# Patient Record
Sex: Male | Born: 1980 | Race: White | Hispanic: No | Marital: Married | Smoking: Never smoker
Health system: Southern US, Community
[De-identification: ages and names within clinical notes are randomized; demographics above are authoritative.]

## PROBLEM LIST (undated history)

## (undated) HISTORY — PX: WRIST SURGERY: SHX841

## (undated) HISTORY — PX: SHOULDER SURGERY: SHX246

## (undated) HISTORY — PX: OTHER SURGICAL HISTORY: SHX169

---

## 2003-11-20 ENCOUNTER — Ambulatory Visit (HOSPITAL_COMMUNITY): Admission: RE | Admit: 2003-11-20 | Discharge: 2003-11-20 | Payer: Self-pay | Admitting: Hematology & Oncology

## 2004-12-11 ENCOUNTER — Ambulatory Visit: Payer: Self-pay | Admitting: Hematology & Oncology

## 2015-04-04 ENCOUNTER — Ambulatory Visit (INDEPENDENT_AMBULATORY_CARE_PROVIDER_SITE_OTHER): Payer: Commercial Managed Care - PPO | Admitting: Podiatry

## 2015-04-04 ENCOUNTER — Encounter: Payer: Self-pay | Admitting: Podiatry

## 2015-04-04 VITALS — BP 130/85 | HR 61 | Resp 16

## 2015-04-04 DIAGNOSIS — L03032 Cellulitis of left toe: Secondary | ICD-10-CM

## 2015-04-04 DIAGNOSIS — L600 Ingrowing nail: Secondary | ICD-10-CM

## 2015-04-04 NOTE — Progress Notes (Signed)
Subjective:     Patient ID: Tony Dixon, male   DOB: 1980-12-21, 34 y.o.   MRN: [id number]  HPIThis patient presents to office with infected ingrown toenail medial border left big toe.  He says this has been painful and infected for months.  He presents for evaluation and treatment.   Review of Systems     Objective:   Physical Exam Objective: Review of past medical history, medications, social history and allergies were performed.  Vascular: Dorsalis pedis and posterior tibial pulses were palpable B/L, capillary refill was  WNL B/L, temperature gradient was WNL B/L   Skin:  No signs of symptoms of infection or ulcers on both feet  Nails: pain redness and swelling along medial border left great toe.  Sensory: Phoebe PerchSemmes Weinstein monifilament WNL   Orthopedic: Orthopedic evaluation demonstrates all joints distal t ankle have full ROM without crepitus, muscle power WNL B/L     Assessment:     Paronychia medial border left great toe.     Plan:     ROV  I & D medial border left great toe under local anesthesia.  Neosporin/DSD.  Told to take his antibiotics which he already has at home.  Home instructions given.

## 2015-11-29 ENCOUNTER — Encounter: Payer: Self-pay | Admitting: Podiatry

## 2015-11-29 ENCOUNTER — Ambulatory Visit (INDEPENDENT_AMBULATORY_CARE_PROVIDER_SITE_OTHER): Payer: Commercial Managed Care - PPO | Admitting: Podiatry

## 2015-11-29 VITALS — BP 139/90 | HR 69 | Resp 12

## 2015-11-29 DIAGNOSIS — L03031 Cellulitis of right toe: Secondary | ICD-10-CM | POA: Diagnosis not present

## 2015-11-29 DIAGNOSIS — L600 Ingrowing nail: Secondary | ICD-10-CM

## 2015-11-29 DIAGNOSIS — L03011 Cellulitis of right finger: Secondary | ICD-10-CM

## 2015-11-29 MED ORDER — CEPHALEXIN 500 MG PO CAPS: 500.0000 mg | ORAL_CAPSULE | Freq: Two times a day (BID) | ORAL | Status: AC

## 2015-11-29 MED ORDER — DOXYCYCLINE HYCLATE 100 MG PO TABS: 100.0000 mg | ORAL_TABLET | Freq: Two times a day (BID) | ORAL | Status: DC

## 2015-11-29 NOTE — Progress Notes (Signed)
Subjective:     Patient ID: Tony FewDavid R Rieser, male   DOB: Feb 13, 1981, 35 y.o.   MRN: [id number]  HPI this patient presents the office with chief complaint of a painful right  big toe. He states that the pain has been present for months. He has attempted Epsom salts and peroxide but the problem persists. He says he was treated by Dr. Elijah Birkom who performed nail surgery last year but the problem has returned.   the right  great toe is painful walking and wearing his shoes. He presents the office for an evaluation and treatment. This patient was treated myself for nail problem to his left foot which has healed uneventfully.   Review of Systems     Objective:   Physical Exam GENERAL APPEARANCE: Alert, conversant. Appropriately groomed. No acute distress.  VASCULAR: Pedal pulses palpable at  [facility] and PT bilateral.  Capillary refill time is immediate to all digits,  Normal temperature gradient.  Digital hair growth is present bilateral  NEUROLOGIC: sensation is normal to 5.07 monofilament at 5/5 sites bilateral.  Light touch is intact bilateral, Muscle strength normal.  MUSCULOSKELETAL: acceptable muscle strength, tone and stability bilateral.  Intrinsic muscluature intact bilateral.  Rectus appearance of foot and digits noted bilateral.   DERMATOLOGIC: skin color, texture, and turgor are within normal limits.  No preulcerative lesions or ulcers  are seen, no interdigital maceration noted.  No open lesions present.  NAILS Pain redness and swelling with pain outside border right big toe.  Marked incurvation medial border right great toe.         Assessment:     Ingrown toenail right foot   Paronychia right hallux.     Plan:     ROV  Nail surgery right foot.  Treatment options and alternatives discussed.  Recommended permanent phenol matrixectomy and patient agreed.  Right hallux  was prepped with alcohol and a toe block of 3cc of 2% lidocaine plain was administered in a digital toe block. .  The toe was  then prepped with betadine solution .  The medial and lateral  nail borders  were then excised and matrix tissue exposed.  Phenol was then applied to the matrix tissue followed by an alcohol wash.  Antibiotic ointment and a dry sterile dressing was applied.  The patient was dispensed instructions for aftercare.Prescribe cephalexin 500 mg  # 15.  RTC 2 weeks.   Helane GuntherGregory Gelsey Amyx DPM      Helane GuntherGregory Azavier Creson DPM

## 2015-12-10 DIAGNOSIS — Z792 Long term (current) use of antibiotics: Secondary | ICD-10-CM | POA: Diagnosis not present

## 2015-12-10 DIAGNOSIS — K8020 Calculus of gallbladder without cholecystitis without obstruction: Secondary | ICD-10-CM | POA: Diagnosis not present

## 2015-12-10 DIAGNOSIS — R1010 Upper abdominal pain, unspecified: Secondary | ICD-10-CM | POA: Diagnosis present

## 2015-12-10 NOTE — ED Notes (Addendum)
Pt to triage via w/c; st onset ago, sudden onset upper abd pain, nonradiating; denies hx of same; pt pale, appears uncomfortable, grimacing

## 2015-12-11 ENCOUNTER — Emergency Department
Admission: EM | Admit: 2015-12-11 | Discharge: 2015-12-11 | Disposition: A | Discharge status: Home / Self Care | Payer: Commercial Managed Care - PPO | Attending: Emergency Medicine | Admitting: Emergency Medicine

## 2015-12-11 ENCOUNTER — Emergency Department: Payer: Commercial Managed Care - PPO

## 2015-12-11 ENCOUNTER — Encounter: Payer: Self-pay | Admitting: Emergency Medicine

## 2015-12-11 DIAGNOSIS — R109 Unspecified abdominal pain: Secondary | ICD-10-CM

## 2015-12-11 DIAGNOSIS — K8020 Calculus of gallbladder without cholecystitis without obstruction: Secondary | ICD-10-CM

## 2015-12-11 LAB — CBC
HCT: 38.4 % — ABNORMAL LOW (ref 40.0–52.0)
Hemoglobin: 14 g/dL (ref 13.0–18.0)
MCH: 30.1 pg (ref 26.0–34.0)
MCHC: 36.5 g/dL — ABNORMAL HIGH (ref 32.0–36.0)
MCV: 82.5 fL (ref 80.0–100.0)
Platelets: 229 K/uL (ref 150–440)
RBC: 4.66 MIL/uL (ref 4.40–5.90)
RDW: 20.4 % — ABNORMAL HIGH (ref 11.5–14.5)
WBC: 9.5 K/uL (ref 3.8–10.6)

## 2015-12-11 LAB — TROPONIN I: Troponin I: <0.03 ng/mL

## 2015-12-11 LAB — COMPREHENSIVE METABOLIC PANEL
ALBUMIN: 5 g/dL (ref 3.5–5.0)
ALT: 47 U/L (ref 17–63)
AST: 63 U/L — AB (ref 15–41)
Alkaline Phosphatase: 50 U/L (ref 38–126)
Anion gap: 5 (ref 5–15)
BUN: 17 mg/dL (ref 6–20)
CHLORIDE: 104 mmol/L (ref 101–111)
CO2: 29 mmol/L (ref 22–32)
CREATININE: 1.15 mg/dL (ref 0.61–1.24)
Calcium: 9.6 mg/dL (ref 8.9–10.3)
GFR calc non Af Amer: >60 mL/min (ref >60)
GLUCOSE: 107 mg/dL — AB (ref 65–99)
Potassium: 3.1 mmol/L — ABNORMAL LOW (ref 3.5–5.1)
SODIUM: 138 mmol/L (ref 135–145)
Total Bilirubin: 2.3 mg/dL — ABNORMAL HIGH (ref 0.3–1.2)
Total Protein: 7.6 g/dL (ref 6.5–8.1)

## 2015-12-11 LAB — LIPASE, BLOOD: Lipase: 19 U/L (ref 11–51)

## 2015-12-11 MED ORDER — MORPHINE SULFATE (PF) 4 MG/ML IV SOLN
4.0000 mg | Freq: Once | INTRAVENOUS | Status: AC
  Administered 2015-12-11: 4 mg via INTRAVENOUS
  Filled 2015-12-11: qty 1

## 2015-12-11 MED ORDER — SODIUM CHLORIDE 0.9 % IV BOLUS (SEPSIS)
1000.0000 mL | Freq: Once | INTRAVENOUS | Status: AC
  Administered 2015-12-11: 1000 mL via INTRAVENOUS

## 2015-12-11 MED ORDER — OXYCODONE-ACETAMINOPHEN 5-325 MG PO TABS: 1.0000 | ORAL_TABLET | Freq: Four times a day (QID) | ORAL | Status: AC | PRN

## 2015-12-11 MED ORDER — GI COCKTAIL ~~LOC~~
30.0000 mL | Freq: Once | ORAL | Status: AC
  Administered 2015-12-11: 30 mL via ORAL
  Filled 2015-12-11: qty 30

## 2015-12-11 MED ORDER — ONDANSETRON 4 MG PO TBDP: 4.0000 mg | ORAL_TABLET | Freq: Three times a day (TID) | ORAL | Status: AC | PRN

## 2015-12-11 MED ORDER — ONDANSETRON HCL 4 MG/2ML IJ SOLN
4.0000 mg | Freq: Once | INTRAMUSCULAR | Status: AC
  Administered 2015-12-11: 4 mg via INTRAVENOUS
  Filled 2015-12-11: qty 2

## 2015-12-11 NOTE — ED Notes (Signed)
Patient transported to Ultrasound via stretcher 

## 2015-12-11 NOTE — ED Provider Notes (Signed)
[facility] Emergency Department Provider Note  ____________________________________________  Time seen: Approximately 0030 AM  I have reviewed the triage vital signs and the nursing notes.   HISTORY  Chief Complaint Abdominal Pain    HPI Tony Dixon is a 35 y.o. male who comes into the hospital today with some upper abdominal pain. The patient reports that the pain started about 1-1/2 hours before he arrived to the hospital. He reports that the pain is currently 8-9 out of 10 in intensity but it was worse before. The patient has a history of hereditary spherocytosis and he reports that he used to have some abdominal pains when he was 18-19 but it has not been a problem since then. The patient has been nauseous with some burping but no vomiting. The patient reports that his nausea was due to pain. The patient ate some dinner which she reports was banana sandwiches and a chocolate muffin. He reports that he is on some doxycycline for an ingrown toenail. The patient reports that the pain initially started on the left feels as though it's move more to the right. The patient is never had pain this severe before so he was concerned and decided to come into the hospital it checked out.   History reviewed. No pertinent past medical history.  There are no active problems to display for this patient.   Past Surgical History  Procedure Laterality Date  . Wrist surgery    . Shoulder surgery    . Wisdom teeth removal      Current Outpatient Rx  Name  Route  Sig  Dispense  Refill  . cephALEXin (KEFLEX) 500 MG capsule   Oral   Take 1 capsule (500 mg total) by mouth 2 (two) times daily.   20 capsule   0   . ondansetron (ZOFRAN ODT) 4 MG disintegrating tablet   Oral   Take 1 tablet (4 mg total) by mouth every 8 (eight) hours as needed for nausea or vomiting.   20 tablet   0   . oxyCODONE-acetaminophen (ROXICET) 5-325 MG tablet   Oral   Take 1 tablet by mouth  every 6 (six) hours as needed.   12 tablet   0     Allergies Review of patient's allergies indicates no known allergies.  No family history on file.  Social History Social History  Substance Use Topics  . Smoking status: Never Smoker   . Smokeless tobacco: Never Used  . Alcohol Use: No    Review of Systems Constitutional: No fever/chills Eyes: No visual changes. ENT: No sore throat. Cardiovascular: Denies chest pain. Respiratory: Denies shortness of breath. Gastrointestinal:  abdominal pain and nausea, no vomiting.  No diarrhea.  No constipation. Genitourinary: Negative for dysuria. Musculoskeletal: Negative for back pain. Skin: Negative for rash. Neurological: Negative for headaches, focal weakness or numbness.  10-point ROS otherwise negative.  ____________________________________________   PHYSICAL EXAM:  VITAL SIGNS: ED Triage Vitals  Enc Vitals Group     BP 12/11/15 0001 153/111 mmHg     Pulse Rate 12/11/15 0001 74     Resp 12/11/15 0001 20     Temp --      Temp src --      SpO2 12/11/15 0001 100 %     Weight 12/11/15 0001 180 lb (81.647 kg)     Height 12/11/15 0001  (1.778 m)     Head Cir --      Peak Flow --  Pain Score 12/11/15 0001 9     Pain Loc --      Pain Edu? --      Excl. in GC? --     Constitutional: Alert and oriented. Well appearing and in moderate distress. Eyes: Conjunctivae are normal. PERRL. EOMI. Head: Atraumatic. Nose: No congestion/rhinnorhea. Mouth/Throat: Mucous membranes are moist.  Oropharynx non-erythematous. Cardiovascular: Normal rate, regular rhythm. Grossly normal heart sounds.  Good peripheral circulation. Respiratory: Normal respiratory effort.  No retractions. Lungs CTAB. Gastrointestinal: Soft with epigastric and right upper quadrant tenderness to palpation. No distention. Positive bowel sounds Musculoskeletal: No lower extremity tenderness nor edema.  Neurologic:  Normal speech and language.  Skin:   Skin is warm, dry and intact.  Psychiatric: Mood and affect are normal.   ____________________________________________   LABS (all labs ordered are listed, but only abnormal results are displayed)  Labs Reviewed  CBC - Abnormal; Notable for the following:    HCT 38.4 (*)    MCHC 36.5 (*)    RDW 20.4 (*)    All other components within normal limits  COMPREHENSIVE METABOLIC PANEL - Abnormal; Notable for the following:    Potassium 3.1 (*)    Glucose, Bld 107 (*)    AST 63 (*)    Total Bilirubin 2.3 (*)    All other components within normal limits  LIPASE, BLOOD  TROPONIN I  TROPONIN I   ____________________________________________  EKG  ED ECG REPORT I, Tony Dixon,  Tony Dixon, the attending physician, personally viewed and interpreted this ECG.   Date: 12/11/2015  EKG Time: 0005  Rate: 66  Rhythm: normal sinus rhythm  Axis: normal  Intervals:none  ST&T Change: none  ____________________________________________  RADIOLOGY  CXR: No cardiopulmonary disease  US abd limited: Cholelithiasis with stones and sludge in the gallbladder, no additional findings to suggest acute cholecystitis. ____________________________________________   PROCEDURES  Procedure(s) performed: None  Critical Care performed: No  ____________________________________________   INITIAL IMPRESSION / ASSESSMENT AND PLAN / ED COURSE  Pertinent labs & imaging results that were available during my care of the patient were reviewed by me and considered in my medical decision making (see chart for details).  This is a 35 year old male who comes into the hospital today with some abdominal pain. The patient did receive a dose of morphine 4 mg, Zofran and liter of normal saline. He reports this pain is much improved but not fully gone. He does appear to have some gallstones. I will give him a GI cocktail as well and then reassess his pain.  The patient is not in any pain at this time. I did repeat the  patient's troponin is negative. I did have a conversation about him receiving an elective cholecystectomy. I also informed him that should the pain get worse he doesn't return for further evaluation to ensure that he does not have a clogged duct or pancreatitis as well as cholecystitis. The patient understands. He'll be discharged home to follow-up with surgery. ____________________________________________   FINAL CLINICAL IMPRESSION(S) / ED DIAGNOSES  Final diagnoses:  Abdominal pain  Gall stones      Tony ApleyAllison Dixon Luree Palla, MD 12/11/15 [phone]

## 2015-12-11 NOTE — ED Notes (Signed)

## 2015-12-11 NOTE — Discharge Instructions (Signed)
Cholelithiasis °Cholelithiasis (also called gallstones) is a form of gallbladder disease in which gallstones form in your gallbladder. The gallbladder is an organ that stores bile made in the liver, which helps digest fats. Gallstones begin as small crystals and slowly grow into stones. Gallstone pain occurs when the gallbladder spasms and a gallstone is blocking the duct. Pain can also occur when a stone passes out of the duct.  °RISK FACTORS °· Being male.   °· Having multiple pregnancies. Health care providers sometimes advise removing diseased gallbladders before future pregnancies.   °· Being obese. °· Eating a diet heavy in fried foods and fat.   °· Being older than 60 years and increasing age.   °· Prolonged use of medicines containing male hormones.   °· Having diabetes mellitus.   °· Rapidly losing weight.   °· Having a family history of gallstones (heredity).   °SYMPTOMS °· Nausea.   °· Vomiting. °· Abdominal pain.   °· Yellowing of the skin (jaundice).   °· Sudden pain. It may persist from several minutes to several hours. °· Fever.   °· Tenderness to the touch.  °In some cases, when gallstones do not move into the bile duct, people have no pain or symptoms. These are called "silent" gallstones.  °TREATMENT °Silent gallstones do not need treatment. In severe cases, emergency surgery may be required. Options for treatment include: °· Surgery to remove the gallbladder. This is the most common treatment. °· Medicines. These do not always work and may take 6-12 months or more to work. °· Shock wave treatment (extracorporeal biliary lithotripsy). In this treatment an ultrasound machine sends shock waves to the gallbladder to break gallstones into smaller pieces that can pass into the intestines or be dissolved by medicine. °HOME CARE INSTRUCTIONS  °· Only take over-the-counter or prescription medicines for pain, discomfort, or fever as directed by your health care provider.   °· Follow a low-fat diet until  seen again by your health care provider. Fat causes the gallbladder to contract, which can result in pain.   °· Follow up with your health care provider as directed. Attacks are almost always recurrent and surgery is usually required for permanent treatment.   °SEEK IMMEDIATE MEDICAL CARE IF:  °· Your pain increases and is not controlled by medicines.   °· You have a fever or persistent symptoms for more than 2-3 days.   °· You have a fever and your symptoms suddenly get worse.   °· You have persistent nausea and vomiting.   °MAKE SURE YOU:  °· Understand these instructions. °· Will watch your condition. °· Will get help right away if you are not doing well or get worse. °  °This information is not intended to replace advice given to you by your health care provider. Make sure you discuss any questions you have with your health care provider. °  °Document Released: 09/04/2005 Document Revised: 05/11/2013 Document Reviewed: 03/02/2013 °Elsevier Interactive Patient Education ©2016 [employer] ° °Abdominal Pain, Adult °Many things can cause abdominal pain. Usually, abdominal pain is not caused by a disease and will improve without treatment. It can often be observed and treated at home. Your health care provider will do a physical exam and possibly order blood tests and X-rays to help determine the seriousness of your pain. However, in many cases, more time must pass before a clear cause of the pain can be found. Before that point, your health care provider may not know if you need more testing or further treatment. °HOME CARE INSTRUCTIONS °Monitor your abdominal pain for any changes. The following actions may help to   alleviate any discomfort you are experiencing: °· Only take over-the-counter or prescription medicines as directed by your health care provider. °· Do not take laxatives unless directed to do so by your health care provider. °· Try a clear liquid diet (broth, tea, or water) as directed by your health  care provider. Slowly move to a bland diet as tolerated. °SEEK MEDICAL CARE IF: °· You have unexplained abdominal pain. °· You have abdominal pain associated with nausea or diarrhea. °· You have pain when you urinate or have a bowel movement. °· You experience abdominal pain that wakes you in the night. °· You have abdominal pain that is worsened or improved by eating food. °· You have abdominal pain that is worsened with eating fatty foods. °· You have a fever. °SEEK IMMEDIATE MEDICAL CARE IF: °· Your pain does not go away within 2 hours. °· You keep throwing up (vomiting). °· Your pain is felt only in portions of the abdomen, such as the right side or the left lower portion of the abdomen. °· You pass bloody or black tarry stools. °MAKE SURE YOU: °· Understand these instructions. °· Will watch your condition. °· Will get help right away if you are not doing well or get worse. °  °This information is not intended to replace advice given to you by your health care provider. Make sure you discuss any questions you have with your health care provider. °  °Document Released: 06/18/2005 Document Revised: 05/30/2015 Document Reviewed: 05/18/2013 °Elsevier Interactive Patient Education ©2016 [employer] Inc[employer] ° °

## 2015-12-13 ENCOUNTER — Ambulatory Visit: Payer: Commercial Managed Care - PPO | Admitting: Podiatry

## 2016-04-24 IMAGING — US US ABDOMEN LIMITED
1 series · 14 of 25 positions shown · non-contrast
Comparison: 11/20/2003

CLINICAL DATA: Abdominal pain for several hours.

EXAM:
US ABDOMEN LIMITED - RIGHT UPPER QUADRANT

[Series 1: us abdomen limited · 0.19mm/px · 14 of 44 slices shown]
[im 1/44]
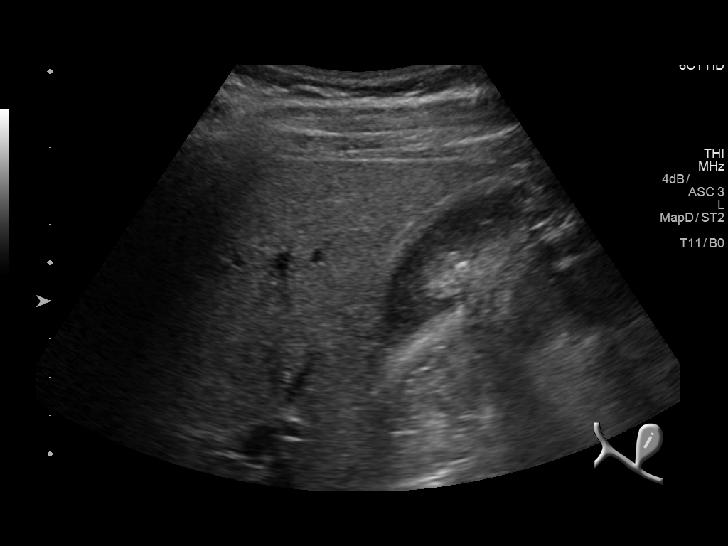
[im 4/44]
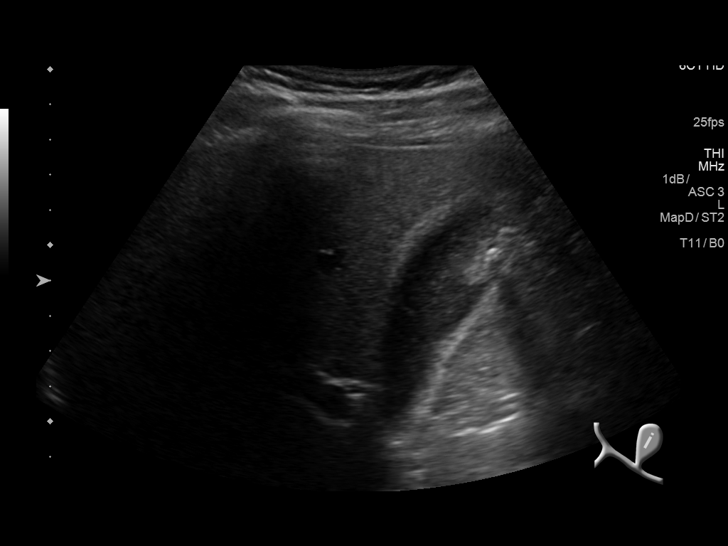
[im 8/44]
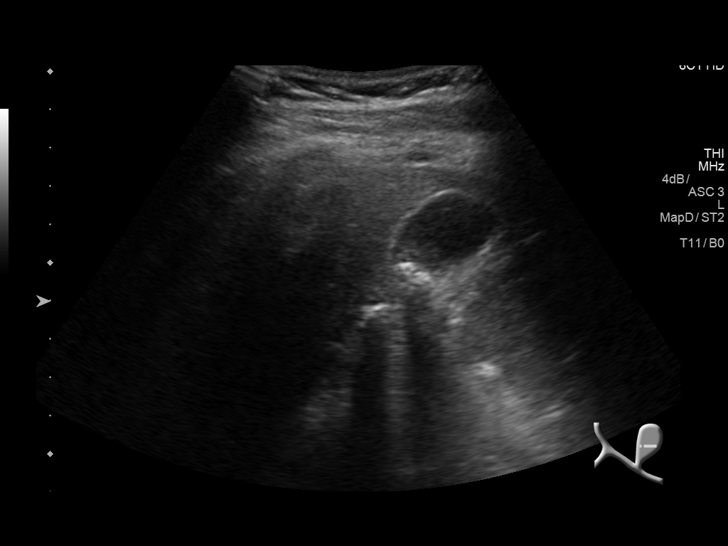
[im 11/44]
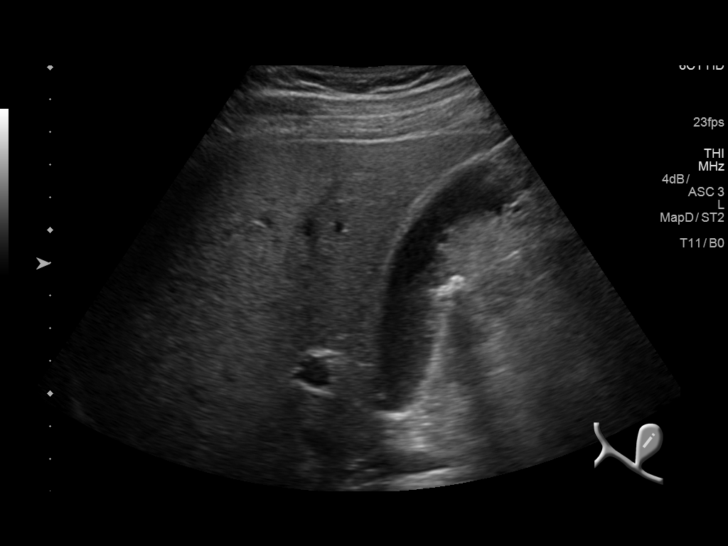
[im 15/44]
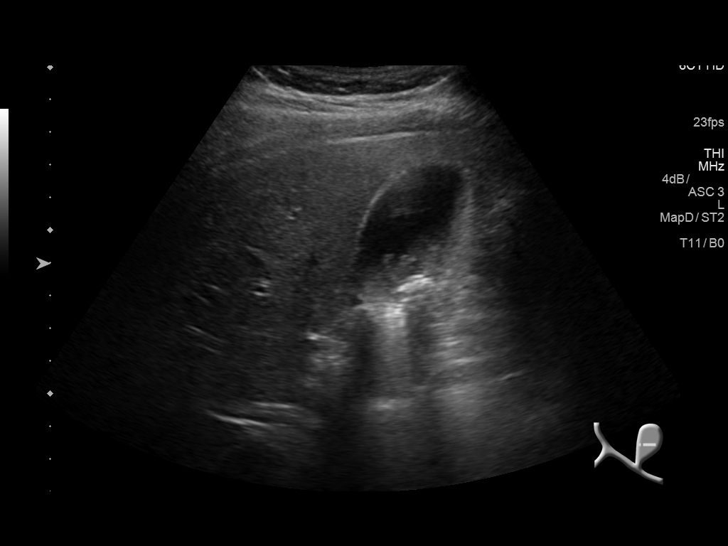
[im 17/44]
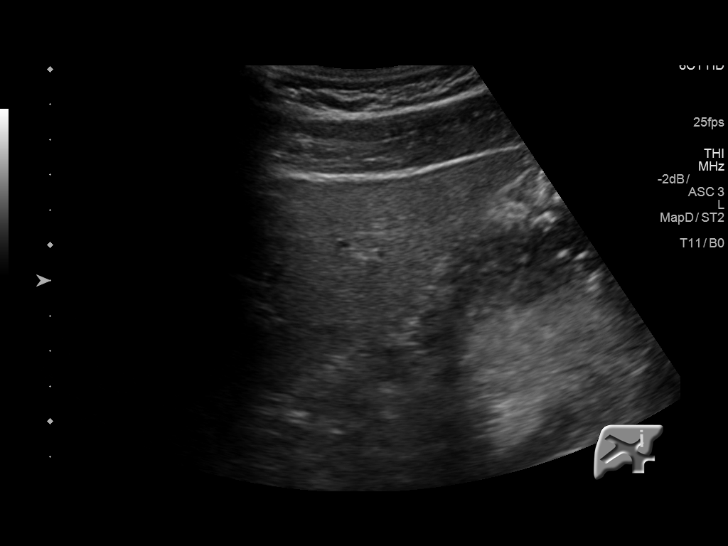
[im 20/44]
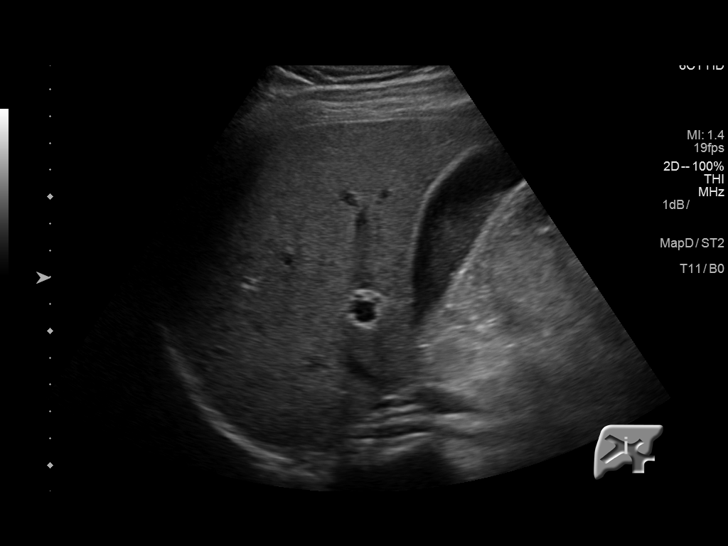
[im 24/44]
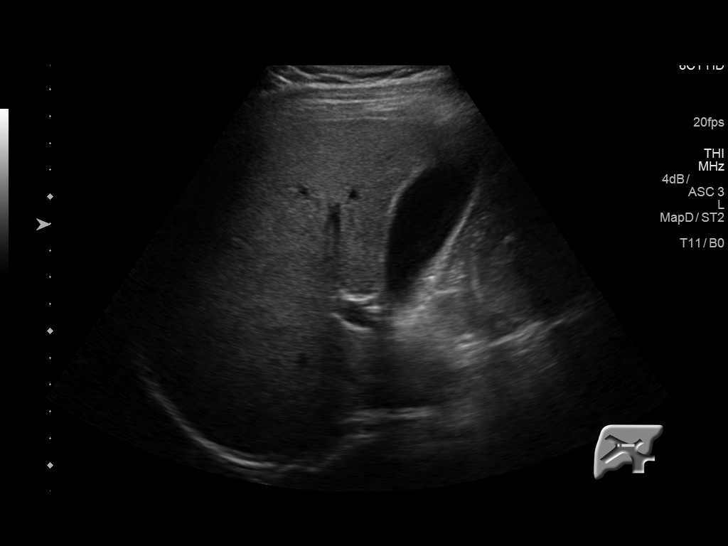
[im 27/44]
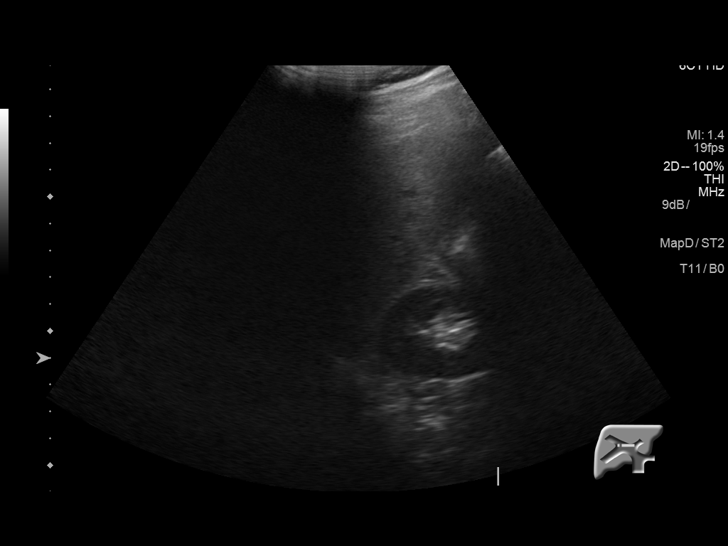
[im 29/44]
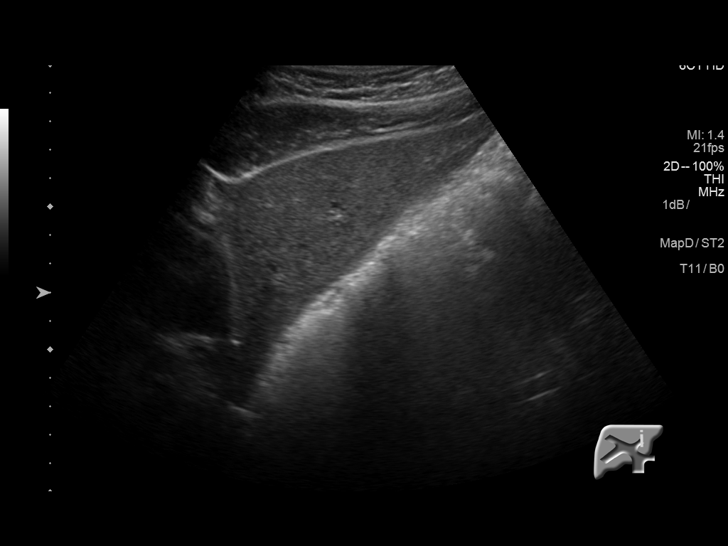
[im 33/44]
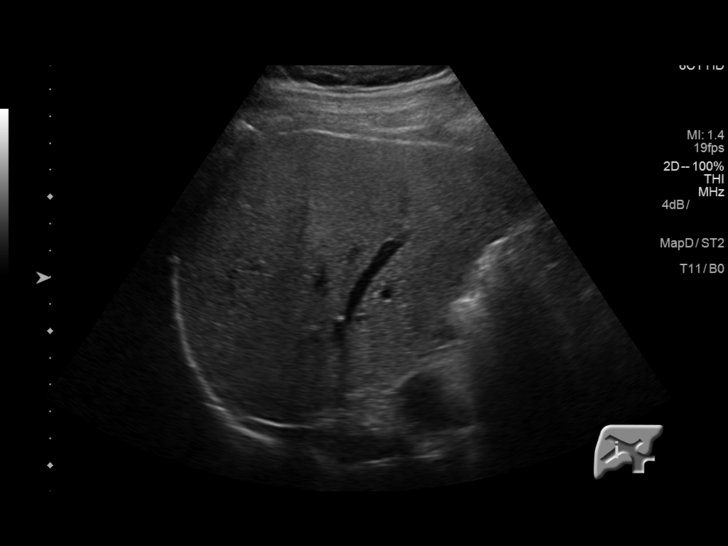
[im 36/44]
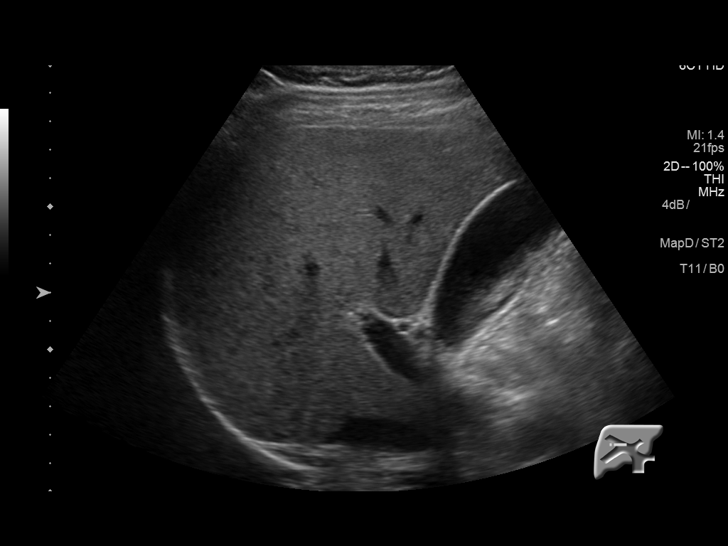
[im 40/44]
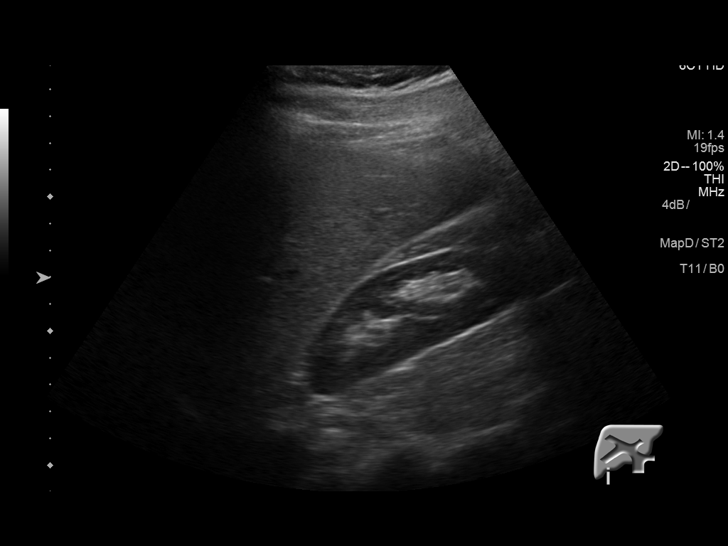
[im 44/44]
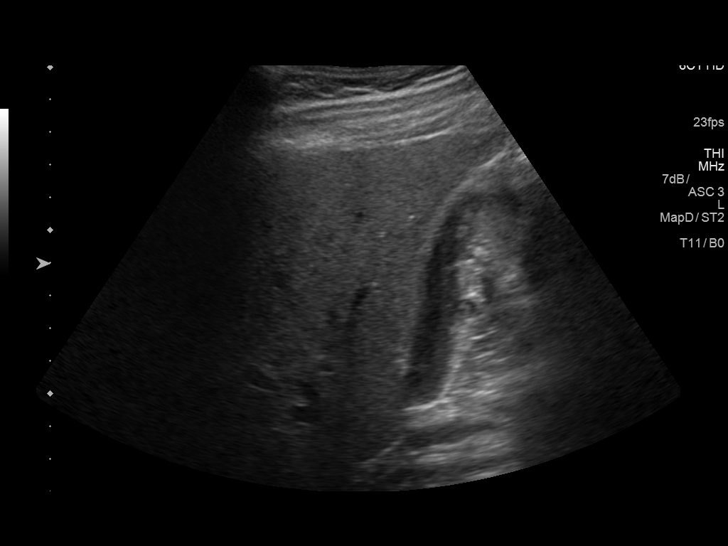

[14 of 25 positions shown; findings below may reference images not displayed]

FINDINGS: Gallbladder:

Multiple stones and sludge demonstrated throughout the dependent
portion of the gallbladder. Largest stone measures about 6 mm in
diameter. No gallbladder wall thickening or edema. Murphy's sign is
negative.

Common bile duct:

Diameter: 4.3 mm, normal

Liver:

No focal lesion identified. Within normal limits in parenchymal
echogenicity.
IMPRESSION: Cholelithiasis with stones and sludge in the gallbladder. No
additional findings to suggest acute cholecystitis.

## 2016-10-18 ENCOUNTER — Encounter (HOSPITAL_COMMUNITY): Payer: Self-pay | Admitting: *Deleted

## 2016-10-18 ENCOUNTER — Ambulatory Visit (HOSPITAL_COMMUNITY)
Admission: EM | Admit: 2016-10-18 | Discharge: 2016-10-18 | Disposition: A | Discharge status: Home / Self Care | Payer: Commercial Managed Care - PPO | Attending: Family Medicine | Admitting: Family Medicine

## 2016-10-18 DIAGNOSIS — S61412A Laceration without foreign body of left hand, initial encounter: Secondary | ICD-10-CM | POA: Diagnosis not present

## 2016-10-18 MED ORDER — TETANUS-DIPHTH-ACELL PERTUSSIS 5-2.5-18.5 LF-MCG/0.5 IM SUSP
INTRAMUSCULAR | Status: AC
Start: 2016-10-18 — End: 2016-10-18
  Filled 2016-10-18: qty 0.5

## 2016-10-18 MED ORDER — LIDOCAINE HCL 2 % IJ SOLN
INTRAMUSCULAR | Status: AC
  Filled 2016-10-18: qty 20

## 2016-10-18 NOTE — ED Notes (Signed)
Limited  Use of l   Hand  Work  Note  Given to  Pt    Per dr Artis Flockkindl

## 2016-10-18 NOTE — Discharge Instructions (Signed)
Care as discussed, return in 7-10  days for suture removal, keep area dry and clean

## 2016-10-18 NOTE — ED Triage Notes (Signed)
Lac  In  Between  l    Ring   And  l   Middle         Finger     Today            Sharp  Tile      Bleeding  Subsided

## 2016-10-18 NOTE — ED Provider Notes (Signed)
[facility]    CSN: [id number] Arrival date & time: 10/18/16  1928     History   Chief Complaint Chief Complaint  Patient presents with  . Laceration    HPI Tony Dixon is a 36 y.o. male.   The history is provided by the patient.  Laceration  Location:  Hand Hand laceration location:  L hand Length:  1cm Depth:  Cutaneous Quality: jagged   Bleeding: controlled   Time since incident:  2 hours Laceration mechanism:  Broken glass (cut between middle and ring fingers on left hand) Pain details:    Quality:  Sharp   Severity:  Mild   Progression:  Unchanged Foreign body present:  No foreign bodies Tetanus status:  Out of date Associated symptoms: no fever and no numbness     History reviewed. No pertinent past medical history.  There are no active problems to display for this patient.   Past Surgical History:  Procedure Laterality Date  . SHOULDER SURGERY    . wisdom teeth removal    . WRIST SURGERY         Home Medications    Prior to Admission medications   Medication Sig Start Date End Date Taking? Authorizing Provider  Doxycycline Hyclate (DOXY-CAPS PO) Take by mouth.   Yes Historical Provider, MD  fluorometholone (FML) 0.1 % ophthalmic ointment 1 application 4 (four) times daily.   Yes Historical Provider, MD  cephALEXin (KEFLEX) 500 MG capsule Take 1 capsule (500 mg total) by mouth 2 (two) times daily. 11/29/15   Helane Gunther, DPM  ondansetron (ZOFRAN ODT) 4 MG disintegrating tablet Take 1 tablet (4 mg total) by mouth every 8 (eight) hours as needed for nausea or vomiting. 12/11/15   Rebecka Apley, MD  oxyCODONE-acetaminophen (ROXICET) 5-325 MG tablet Take 1 tablet by mouth every 6 (six) hours as needed. 12/11/15   Rebecka Apley, MD    Family History History reviewed. No pertinent family history.  Social History Social History  Substance Use Topics  . Smoking status: Never Smoker  . Smokeless tobacco: Never Used  . Alcohol  use No     Allergies   Patient has no known allergies.   Review of Systems Review of Systems  Constitutional: Negative.  Negative for fever.  Musculoskeletal: Negative.   Skin: Positive for wound.  All other systems reviewed and are negative.    Physical Exam Triage Vital Signs ED Triage Vitals  Enc Vitals Group     BP 10/18/16 2035 129/78     Pulse Rate 10/18/16 2035 70     Resp 10/18/16 2035 18     Temp 10/18/16 2035 98.3 F (36.8 C)     Temp Source 10/18/16 2035 Oral     SpO2 10/18/16 2035 100 %     Weight --      Height --      Head Circumference --      Peak Flow --      Pain Score 10/18/16 2039 4     Pain Loc --      Pain Edu? --      Excl. in GC? --    No data found.   Updated Vital Signs BP 129/78 (BP Location: Right Arm)   Pulse 70   Temp 98.3 F (36.8 C) (Oral)   Resp 18   SpO2 100%   Visual Acuity Right Eye Distance:   Left Eye Distance:   Bilateral Distance:    Right Eye  Near:   Left Eye Near:    Bilateral Near:     Physical Exam  Constitutional: He is oriented to person, place, and time. He appears well-developed and well-nourished. No distress.  Musculoskeletal: He exhibits no tenderness.  Neurological: He is alert and oriented to person, place, and time.  Skin: Skin is warm and dry.  Web space lac between 3rd and 4th fingers on left hand, nvt intact , no active bleeding.  Nursing note and vitals reviewed.    UC Treatments / Results  Labs (all labs ordered are listed, but only abnormal results are displayed) Labs Reviewed - No data to display  EKG  EKG Interpretation None       Radiology No results found.  Procedures .Marland Kitchen.Laceration Repair Date/Time: 10/18/2016 8:53 PM Performed by: Linna HoffKINDL, Kveon Casanas D Authorized by: Bradd CanaryKINDL, Glendon Dunwoody D   Consent:    Consent obtained:  Verbal   Consent given by:  Patient   Risks discussed:  Infection   Alternatives discussed:  No treatment Anesthesia (see MAR for exact dosages):     Anesthesia method:  Local infiltration   Local anesthetic:  Lidocaine 1% w/o epi Laceration details:    Location:  Hand Repair type:    Repair type:  Simple Pre-procedure details:    Preparation:  Patient was prepped and draped in usual sterile fashion Exploration:    Wound exploration: wound explored through full range of motion     Contaminated: no   Treatment:    Area cleansed with:  Betadine   Amount of cleaning:  Standard   Irrigation solution:  Sterile saline   Visualized foreign bodies/material removed: no   Skin repair:    Repair method:  Sutures   Suture size:  5-0   Suture material:  Prolene Approximation:    Approximation:  Close   Vermilion border: well-aligned   Post-procedure details:    Dressing:  Antibiotic ointment and sterile dressing   Patient tolerance of procedure:  Tolerated well, no immediate complications Comments:     Tet utd    (including critical care time)  Medications Ordered in UC Medications - No data to display   Initial Impression / Assessment and Plan / UC Course  I have reviewed the triage vital signs and the nursing notes.  Pertinent labs & imaging results that were available during my care of the patient were reviewed by me and considered in my medical decision making (see chart for details).       Final Clinical Impressions(s) / UC Diagnoses   Final diagnoses:  Laceration of left hand without foreign body, initial encounter    New Prescriptions Discharge Medication List as of 10/18/2016  9:14 PM       Linna HoffJames D Rhilynn Preyer, MD 10/23/16 1436

## 2023-10-09 ENCOUNTER — Other Ambulatory Visit (HOSPITAL_COMMUNITY): Payer: Self-pay
